# Patient Record
Sex: Female | Born: 1935 | Race: White | Hispanic: No | Smoking: Never smoker
Health system: Southern US, Community
[De-identification: ages and names within clinical notes are randomized; demographics above are authoritative.]

## PROBLEM LIST (undated history)

## (undated) DIAGNOSIS — F0390 Unspecified dementia without behavioral disturbance: Secondary | ICD-10-CM

## (undated) DIAGNOSIS — I4891 Unspecified atrial fibrillation: Secondary | ICD-10-CM

## (undated) HISTORY — PX: ABDOMINAL HYSTERECTOMY: SHX81

## (undated) HISTORY — PX: THYROIDECTOMY: SHX17

## (undated) HISTORY — PX: KNEE SURGERY: SHX244

---

## 2005-09-19 ENCOUNTER — Other Ambulatory Visit: Payer: Self-pay

## 2005-09-19 ENCOUNTER — Ambulatory Visit: Payer: Self-pay | Admitting: Gastroenterology

## 2005-12-04 ENCOUNTER — Ambulatory Visit: Payer: Self-pay | Admitting: Gastroenterology

## 2006-12-21 ENCOUNTER — Emergency Department: Payer: Self-pay | Admitting: Emergency Medicine

## 2006-12-31 ENCOUNTER — Ambulatory Visit: Payer: Self-pay

## 2007-07-30 ENCOUNTER — Emergency Department: Payer: Self-pay | Admitting: Emergency Medicine

## 2007-07-30 ENCOUNTER — Other Ambulatory Visit: Payer: Self-pay

## 2009-03-01 ENCOUNTER — Ambulatory Visit: Payer: Self-pay

## 2009-06-01 IMAGING — CT CT OF THE LEFT KNEE WITHOUT CONTRAST
2 series · 10 of 14 positions shown, 12 images · non-contrast
Comparison: none

REASON FOR EXAM: 3 d reconstruction  left knee pain  status post fall
COMMENTS:

[Series 3: axial · axial · 0.39mm/px · z∈[-436,-304]mm · 5 of 66 slices shown, 7 images]
[im 11/66  soft-tissue]
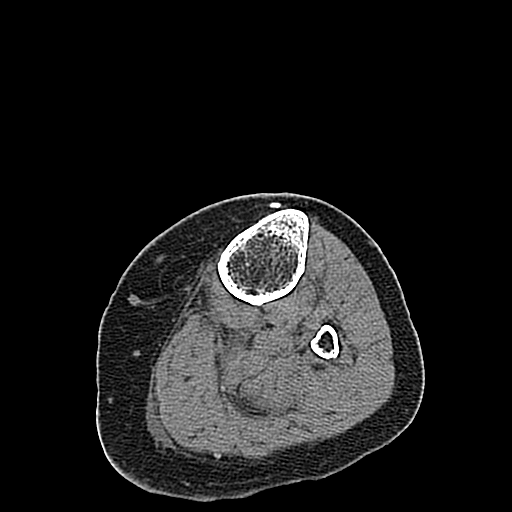
[im 11/66  bone]
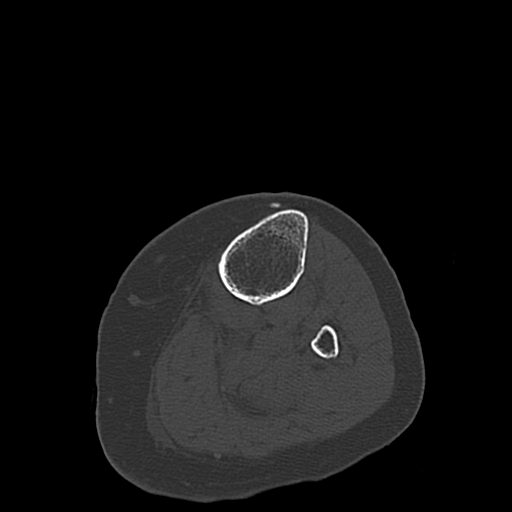
[im 22/66  bone]
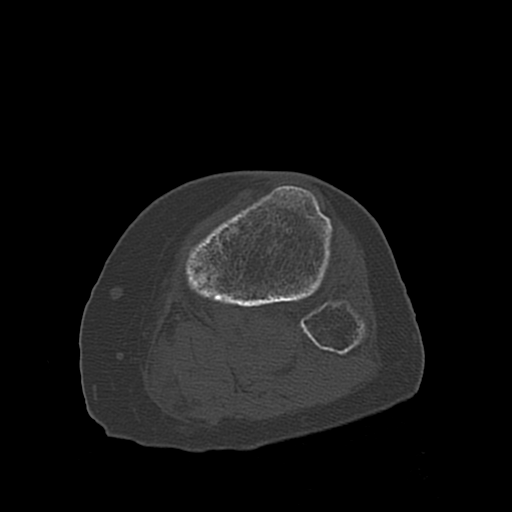
[im 33/66  bone]
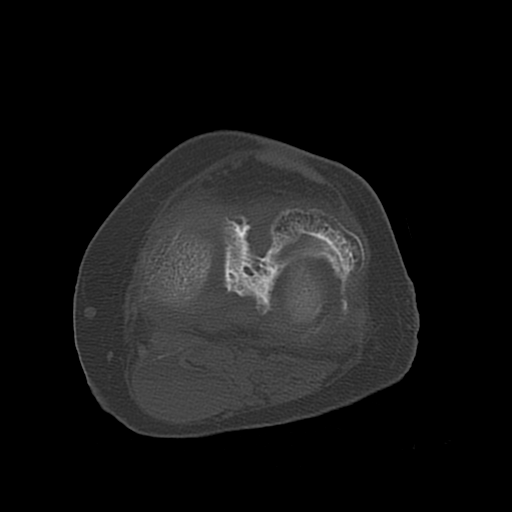
[im 44/66  bone]
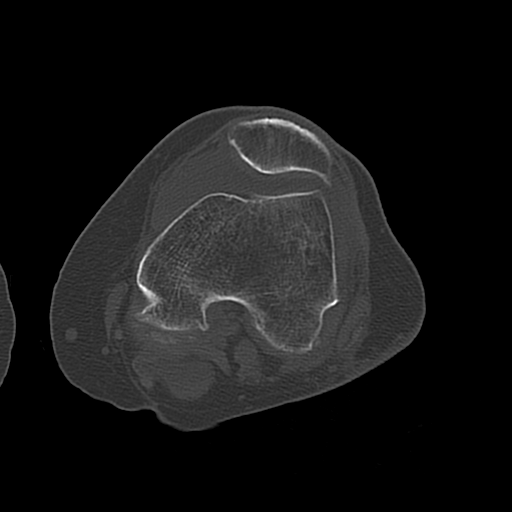
[im 55/66  soft-tissue]
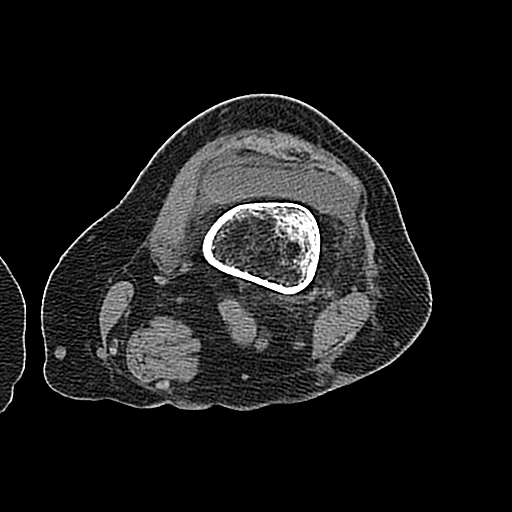
[im 55/66  bone]
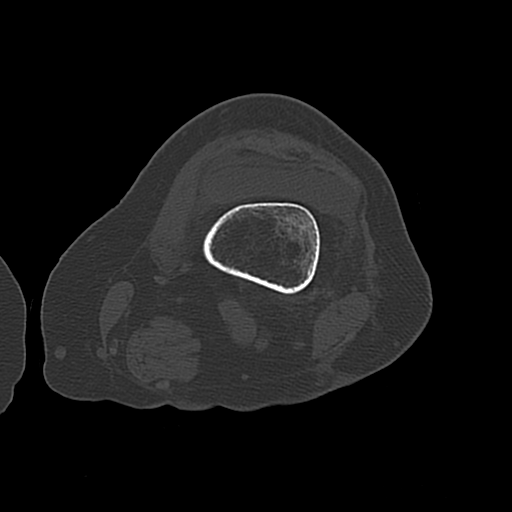

[Series 9: axial soft tissue · axial · 0.39mm/px · z∈[-436,-304]mm · 5 of 66 slices shown]
[im 11/66  soft-tissue]
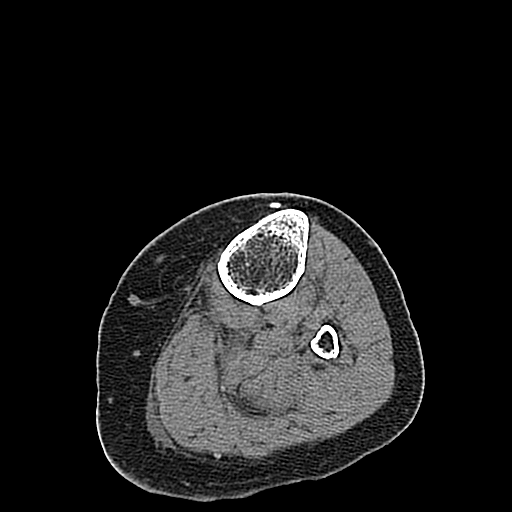
[im 22/66  soft-tissue]
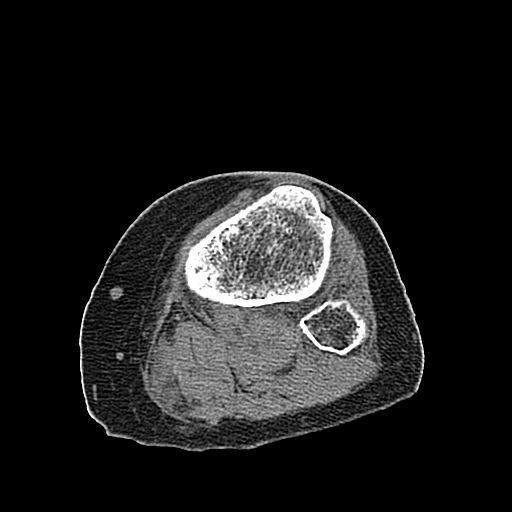
[im 33/66  soft-tissue]
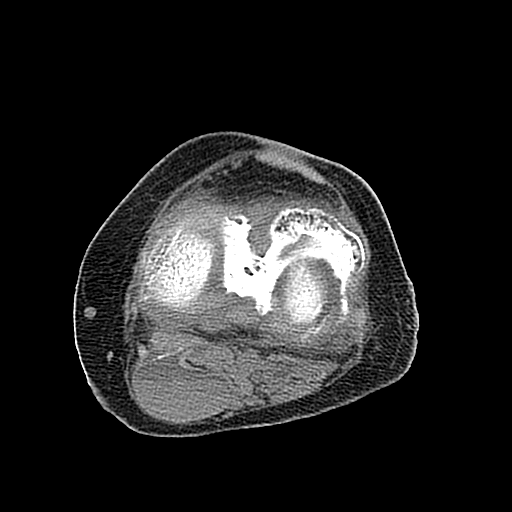
[im 44/66  soft-tissue]
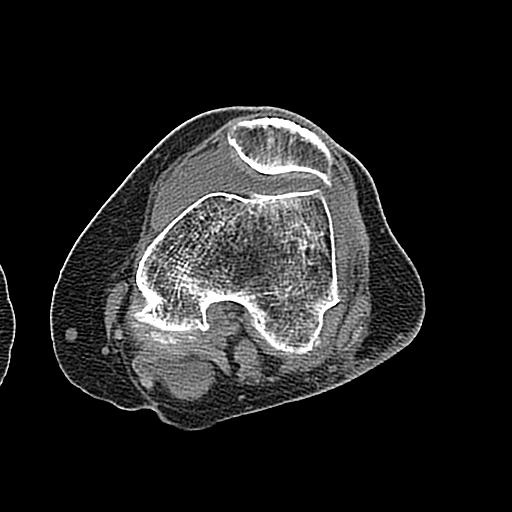
[im 55/66  soft-tissue]
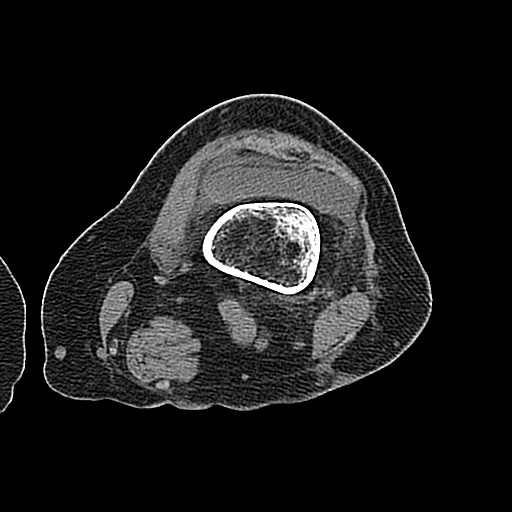

[10 of 14 positions shown; findings below may reference images not displayed]

PROCEDURE:     CT  - CT KNEE LEFT WO  - March 01, 2009 [DATE]

RESULT:     CT of the left knee is reconstructed at bone window settings in
the axial, coronal and sagittal planes. There is osteopenia with
degenerative change diffusely. There is thickening along the lateral tibial
plateau with irregularity of the cortex which could represent chronic
change. On the sagittal reconstructions there appears to be fracture along
the posterior aspect of the lateral tibial plateau with some small fragments
present. There does not appear to be significant depression of the lateral
tibial plateau but a vertical component is present on the sagittal
reconstructions on image #13. The distal femur appears intact. The patella
appears intact. Patellofemoral joint space narrowing is present. The
proximal fibula appears intact.
IMPRESSION: Degenerative changes and osteopenia are present. There does
appear to be proximal tibial fracture especially in the posterior lateral
tibial plateau region.

## 2010-12-23 ENCOUNTER — Emergency Department: Payer: Self-pay | Admitting: Unknown Physician Specialty

## 2011-03-25 IMAGING — CR DG CHEST 1V PORT
1 series · 1 of 1 positions shown · non-contrast
Comparison: none

REASON FOR EXAM: cough sob
COMMENTS:

PROCEDURE:     DXR - DXR PORTABLE CHEST SINGLE VIEW  - December 23, 2010 [DATE]
RESULT:     Comparison: None.

[view not recorded]
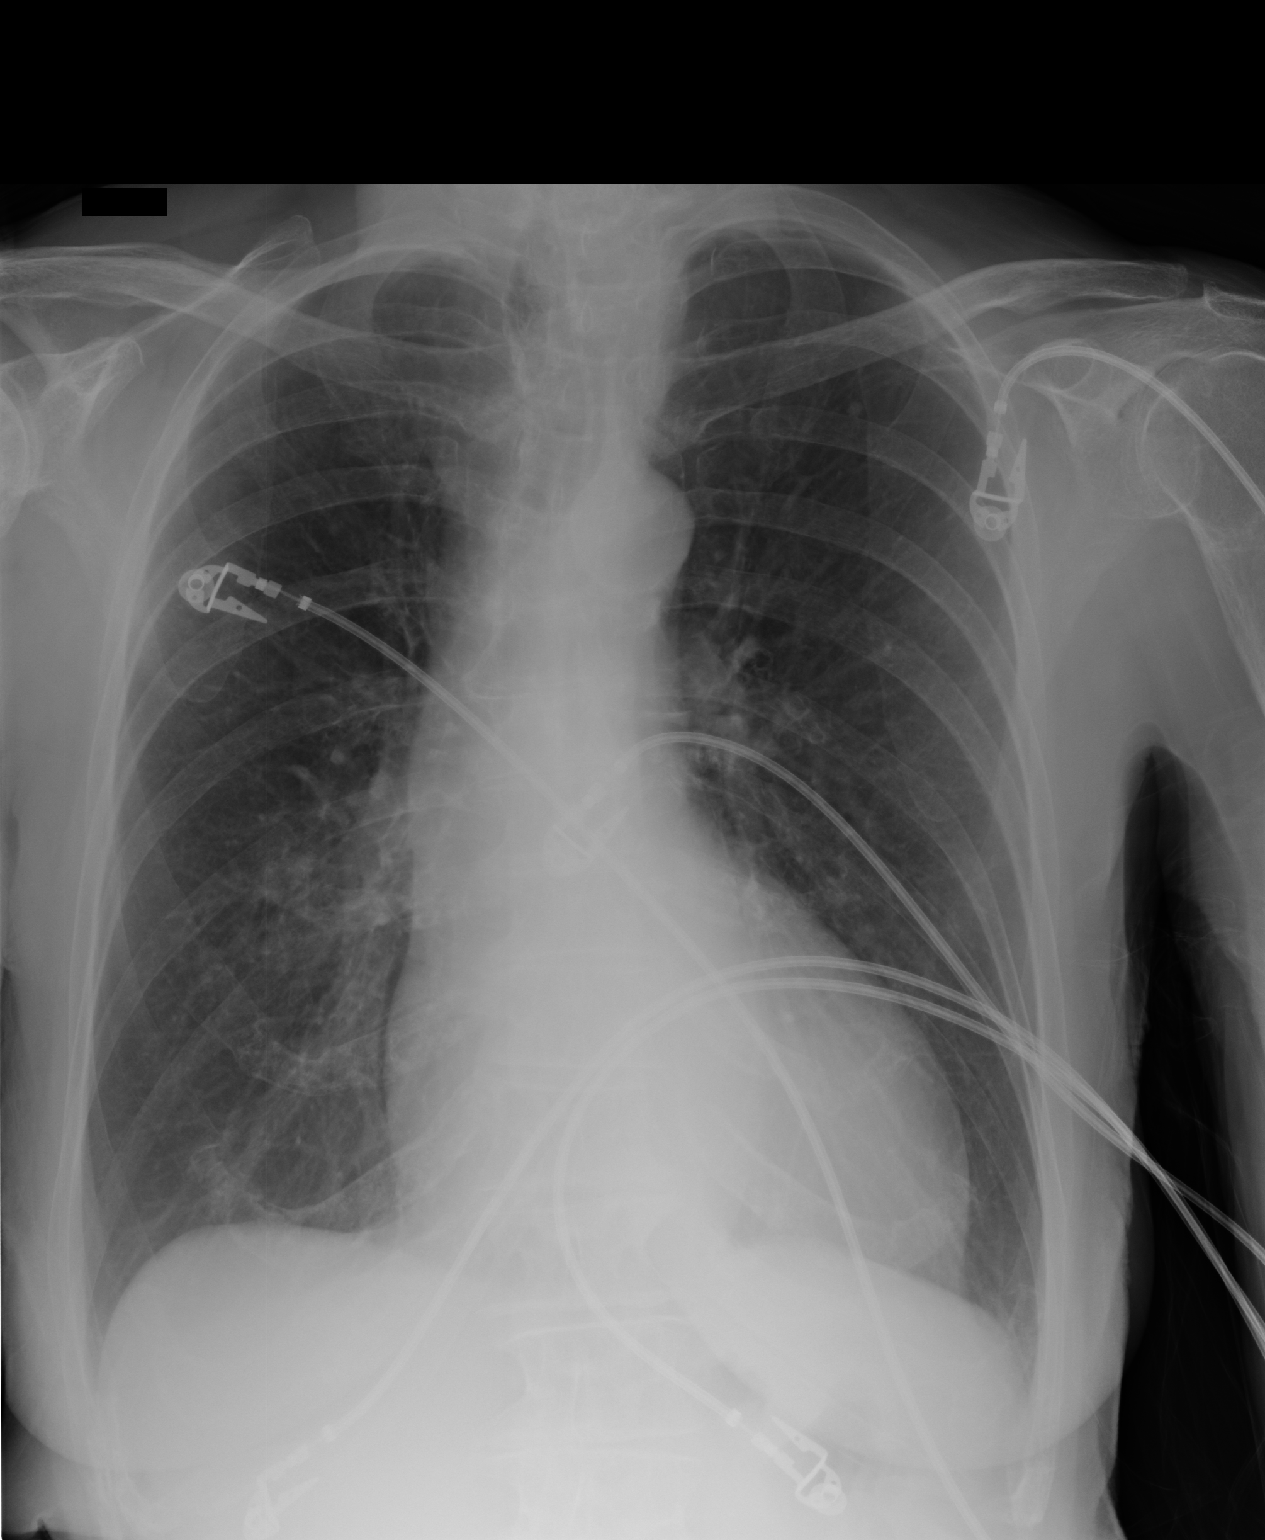

[1 of 1 positions shown; findings below may reference images not displayed]

FINDINGS: The heart is mildly enlarged. Minimal opacity overlying the right lower lung
is felt to be secondary to overlying costochondral junction. No focal
parenchymal opacities. The lungs are hyperinflated. Tiny nodular density in
the left upper lung is likely calcified given its density to small size.
IMPRESSION: 1. Mild hyperinflation. Otherwise, no acute cardio pulmonary disease.
2. Tiny nodular density in the left upper lung is likely calcified given its
density too small size. However, followup radiographs are recommended to
ensure stability.

## 2013-10-14 ENCOUNTER — Emergency Department: Payer: Self-pay | Admitting: Emergency Medicine

## 2013-10-14 LAB — CBC
HCT: 37.8 % (ref 35.0–47.0)
HGB: 12.7 g/dL (ref 12.0–16.0)
MCH: 32.3 pg (ref 26.0–34.0)
MCHC: 33.6 g/dL (ref 32.0–36.0)
MCV: 96 fL (ref 80–100)
RBC: 3.93 10*6/uL (ref 3.80–5.20)
RDW: 13.5 % (ref 11.5–14.5)

## 2013-10-14 LAB — BASIC METABOLIC PANEL
Calcium, Total: 9 mg/dL (ref 8.5–10.1)
Chloride: 104 mmol/L (ref 98–107)
Co2: 28 mmol/L (ref 21–32)
EGFR (African American): 56 — ABNORMAL LOW
EGFR (Non-African Amer.): 48 — ABNORMAL LOW
Potassium: 4.2 mmol/L (ref 3.5–5.1)
Sodium: 139 mmol/L (ref 136–145)

## 2013-10-14 LAB — PROTIME-INR: Prothrombin Time: 24.8 secs — ABNORMAL HIGH (ref 11.5–14.7)

## 2013-10-14 LAB — TROPONIN I: Troponin-I: <0.02 ng/mL

## 2014-01-14 IMAGING — CR DG CHEST 2V
1 series · 3 of 3 positions shown · non-contrast
Comparison: 12/23/2010

CLINICAL DATA: Sternal pain since [REDACTED]. Increased heart rate
sometimes.

EXAM:
CHEST  2 VIEW

[Series 1: w chest pa · 0.14mm/px · 3 of 3 slices shown]
[im 1/3]
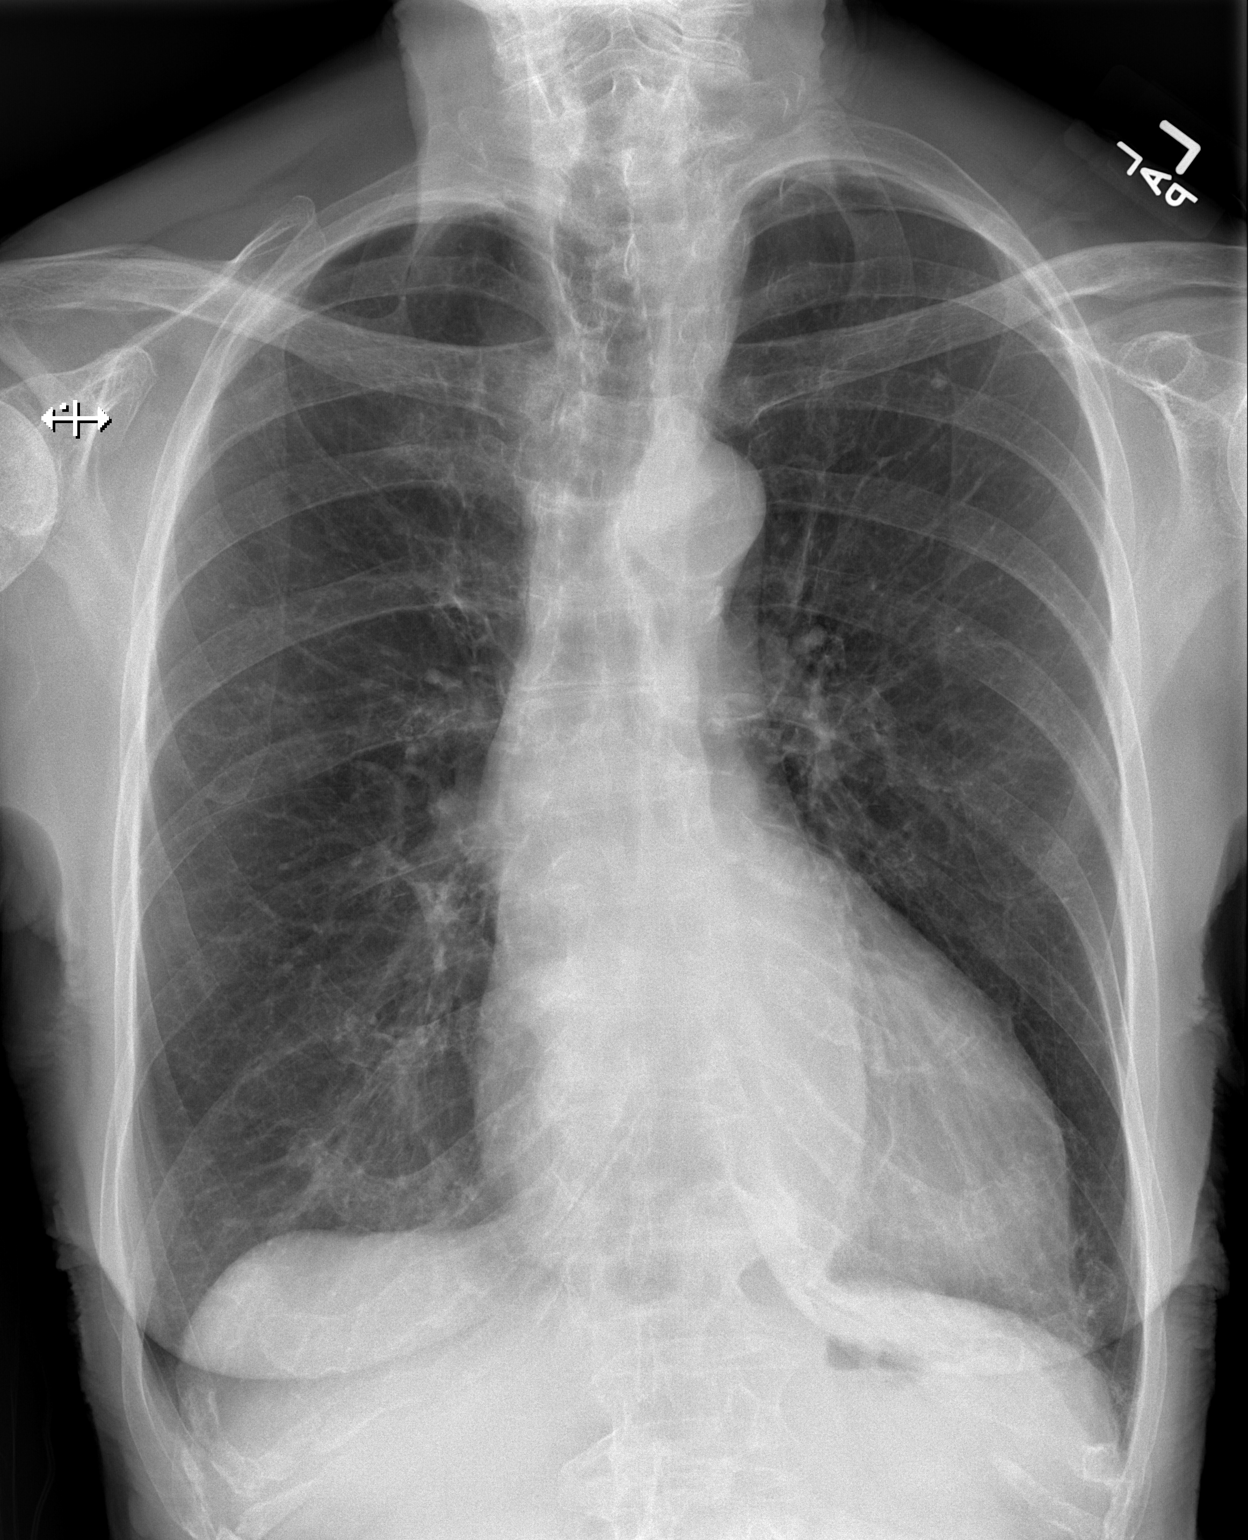
[im 2/3]
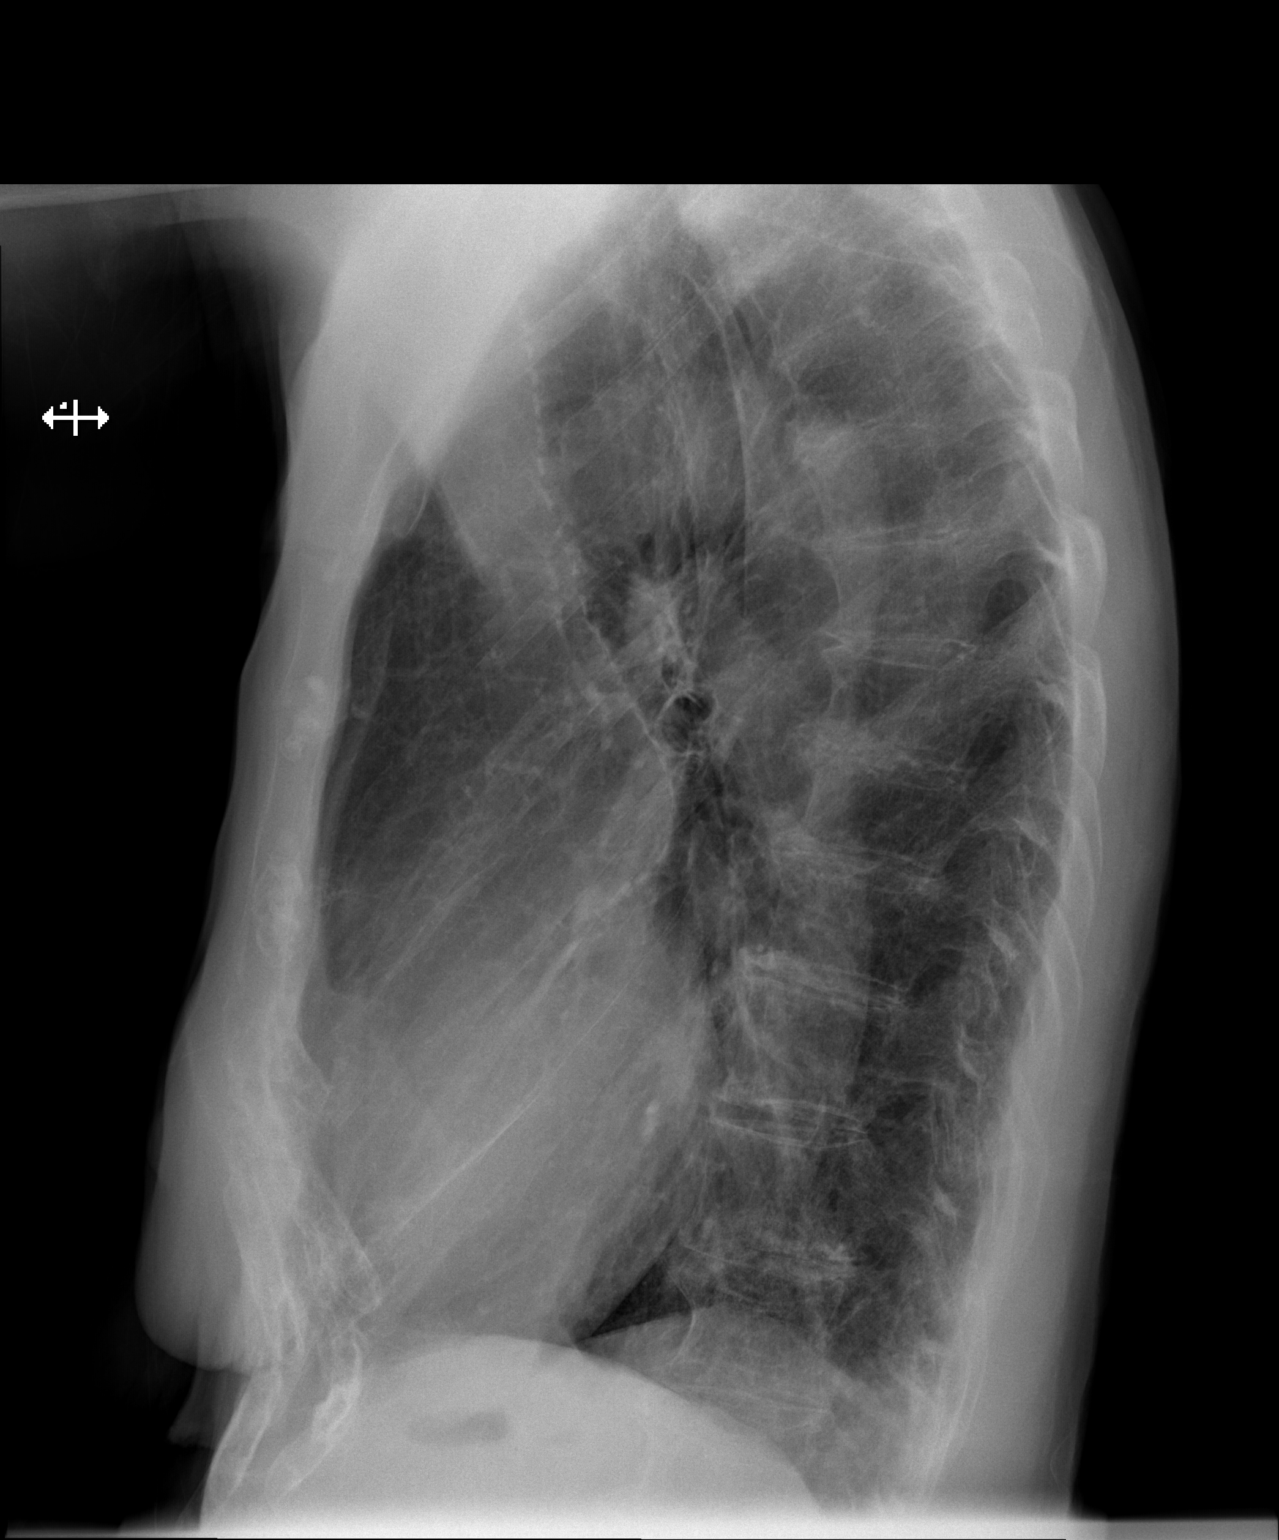
[im 3/3]
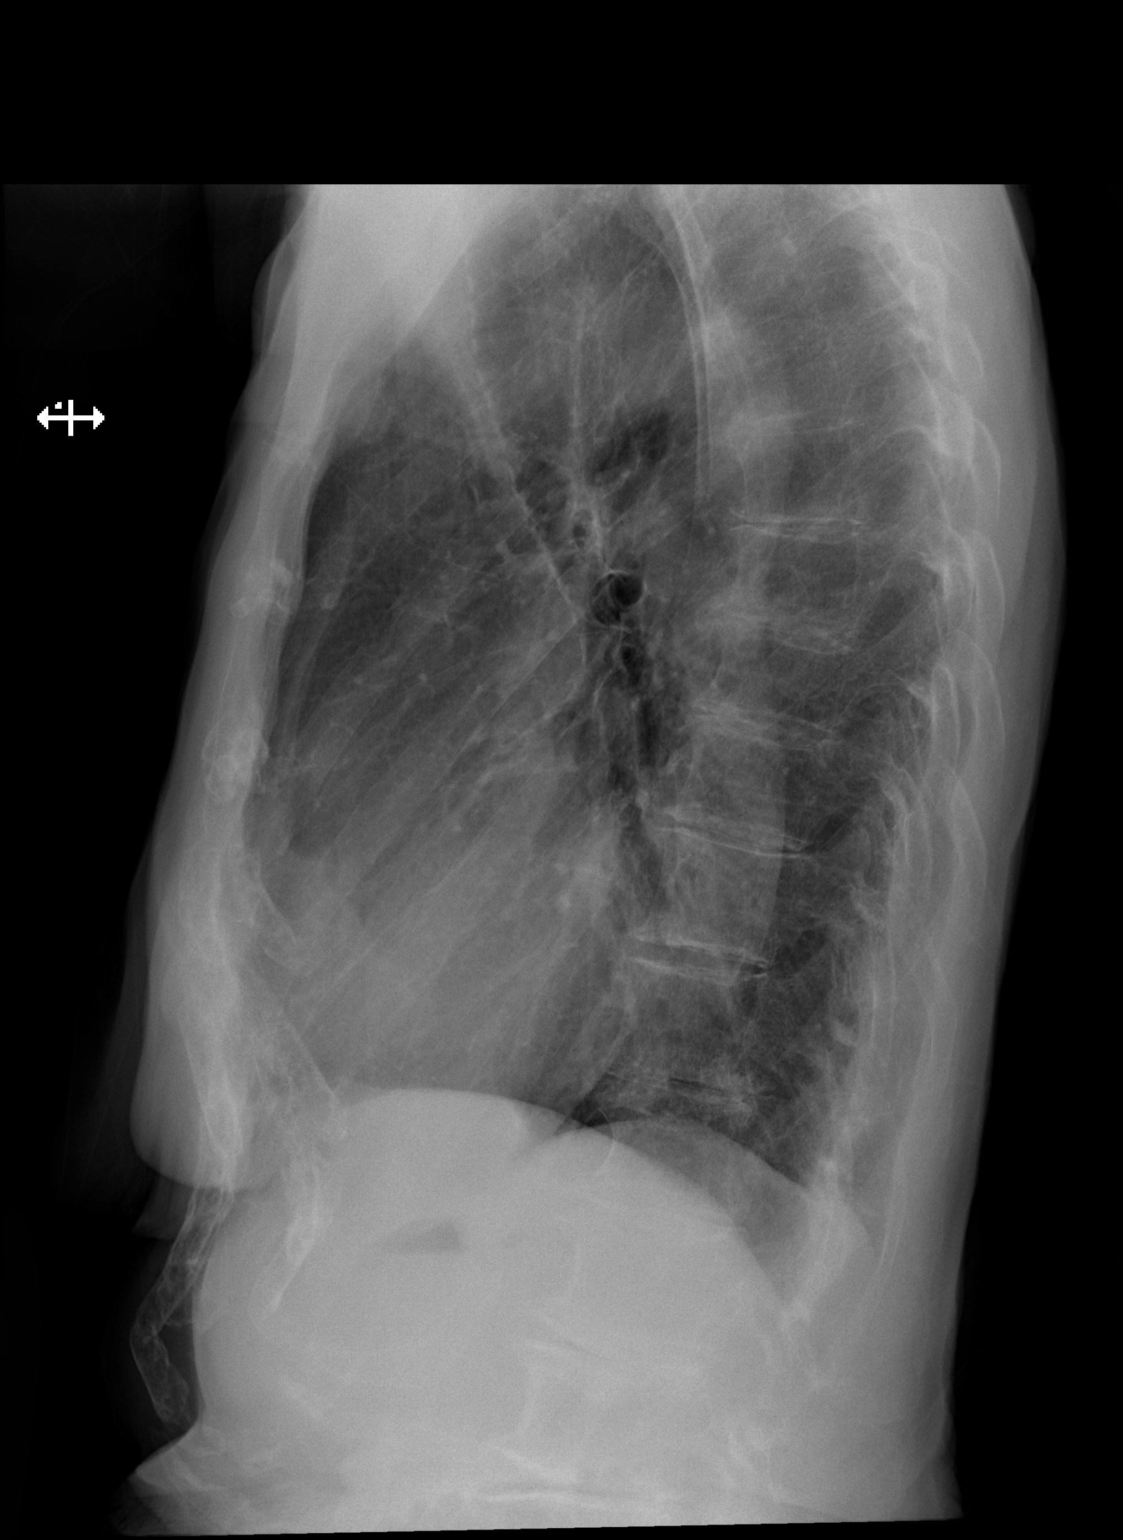

[3 of 3 positions shown; findings below may reference images not displayed]

FINDINGS: The lungs are hyperinflated likely secondary to COPD. There is a
mm left upper lobe pulmonary nodule unchanged compared with
12/23/2010. There is no focal parenchymal opacity, pleural effusion,
or pneumothorax. The heart and mediastinal contours are
unremarkable.

The osseous structures are unremarkable.
IMPRESSION: No active cardiopulmonary disease.

## 2014-04-27 ENCOUNTER — Emergency Department: Payer: Self-pay | Admitting: Emergency Medicine

## 2014-04-27 LAB — URINALYSIS, COMPLETE
BACTERIA: NONE SEEN
BILIRUBIN, UR: NEGATIVE
BLOOD: NEGATIVE
Glucose,UR: 50 mg/dL (ref 0–75)
Ketone: NEGATIVE
NITRITE: NEGATIVE
Ph: 5 (ref 4.5–8.0)
Protein: 100
RBC,UR: 12 /HPF (ref 0–5)
Specific Gravity: 1.021 (ref 1.003–1.030)

## 2014-04-27 LAB — CBC WITH DIFFERENTIAL/PLATELET
BASOS PCT: 0.6 %
Basophil #: 0 10*3/uL (ref 0.0–0.1)
EOS ABS: 0.1 10*3/uL (ref 0.0–0.7)
Eosinophil %: 0.8 %
HCT: 42.9 % (ref 35.0–47.0)
HGB: 14 g/dL (ref 12.0–16.0)
LYMPHS PCT: 13 %
Lymphocyte #: 0.8 10*3/uL — ABNORMAL LOW (ref 1.0–3.6)
MCH: 32 pg (ref 26.0–34.0)
MCHC: 32.7 g/dL (ref 32.0–36.0)
MCV: 98 fL (ref 80–100)
MONO ABS: 0.4 x10 3/mm (ref 0.2–0.9)
Monocyte %: 5.8 %
NEUTROS PCT: 79.8 %
Neutrophil #: 5.2 10*3/uL (ref 1.4–6.5)
Platelet: 217 10*3/uL (ref 150–440)
RBC: 4.38 10*6/uL (ref 3.80–5.20)
RDW: 13.7 % (ref 11.5–14.5)
WBC: 6.5 10*3/uL (ref 3.6–11.0)

## 2014-04-27 LAB — BASIC METABOLIC PANEL
Anion Gap: 9 (ref 7–16)
BUN: 24 mg/dL — ABNORMAL HIGH (ref 7–18)
CALCIUM: 8.8 mg/dL (ref 8.5–10.1)
CO2: 23 mmol/L (ref 21–32)
Chloride: 112 mmol/L — ABNORMAL HIGH (ref 98–107)
Creatinine: 1.33 mg/dL — ABNORMAL HIGH (ref 0.60–1.30)
EGFR (Non-African Amer.): 36 — ABNORMAL LOW
GFR CALC AF AMER: 42 — AB
Glucose: 80 mg/dL (ref 65–99)
Osmolality: 290 (ref 275–301)
POTASSIUM: 4.4 mmol/L (ref 3.5–5.1)
SODIUM: 144 mmol/L (ref 136–145)

## 2014-04-27 LAB — TSH: THYROID STIMULATING HORM: 5.52 u[IU]/mL — AB

## 2014-04-27 LAB — TROPONIN I: Troponin-I: <0.02 ng/mL

## 2014-04-28 LAB — URINE CULTURE

## 2015-10-28 ENCOUNTER — Inpatient Hospital Stay
Admission: EM | Admit: 2015-10-28 | Discharge: 2015-10-30 | DRG: 918 | Disposition: A | Discharge status: Home / Self Care | Payer: Medicare PPO | Attending: Internal Medicine | Admitting: Internal Medicine

## 2015-10-28 ENCOUNTER — Encounter: Payer: Self-pay | Admitting: Emergency Medicine

## 2015-10-28 DIAGNOSIS — Z90710 Acquired absence of both cervix and uterus: Secondary | ICD-10-CM

## 2015-10-28 DIAGNOSIS — I482 Chronic atrial fibrillation: Secondary | ICD-10-CM | POA: Diagnosis present

## 2015-10-28 DIAGNOSIS — Z7901 Long term (current) use of anticoagulants: Secondary | ICD-10-CM

## 2015-10-28 DIAGNOSIS — Z79899 Other long term (current) drug therapy: Secondary | ICD-10-CM

## 2015-10-28 DIAGNOSIS — I10 Essential (primary) hypertension: Secondary | ICD-10-CM | POA: Diagnosis present

## 2015-10-28 DIAGNOSIS — K920 Hematemesis: Secondary | ICD-10-CM | POA: Diagnosis present

## 2015-10-28 DIAGNOSIS — E039 Hypothyroidism, unspecified: Secondary | ICD-10-CM | POA: Diagnosis present

## 2015-10-28 DIAGNOSIS — I350 Nonrheumatic aortic (valve) stenosis: Secondary | ICD-10-CM | POA: Diagnosis present

## 2015-10-28 DIAGNOSIS — E890 Postprocedural hypothyroidism: Secondary | ICD-10-CM | POA: Diagnosis present

## 2015-10-28 DIAGNOSIS — F0390 Unspecified dementia without behavioral disturbance: Secondary | ICD-10-CM | POA: Diagnosis present

## 2015-10-28 DIAGNOSIS — Z9889 Other specified postprocedural states: Secondary | ICD-10-CM

## 2015-10-28 DIAGNOSIS — T45511A Poisoning by anticoagulants, accidental (unintentional), initial encounter: Secondary | ICD-10-CM | POA: Diagnosis not present

## 2015-10-28 HISTORY — DX: Unspecified atrial fibrillation: I48.91

## 2015-10-28 HISTORY — DX: Unspecified dementia, unspecified severity, without behavioral disturbance, psychotic disturbance, mood disturbance, and anxiety: F03.90

## 2015-10-28 NOTE — ED Notes (Signed)
EMS states they were called out for chest pain but on arrival pt states she just feels bad. Pt started vomiting while EMS was at home. Pt was given 4 mg Zofran IV PTA with no relief. Upon arrival to ER pt denies pain and states she just hasn't felt good today. Pt vomited small amount x1 during triage.

## 2015-10-29 ENCOUNTER — Encounter: Payer: Self-pay | Admitting: Internal Medicine

## 2015-10-29 DIAGNOSIS — I10 Essential (primary) hypertension: Secondary | ICD-10-CM | POA: Diagnosis present

## 2015-10-29 DIAGNOSIS — Z7901 Long term (current) use of anticoagulants: Secondary | ICD-10-CM | POA: Diagnosis not present

## 2015-10-29 DIAGNOSIS — F0390 Unspecified dementia without behavioral disturbance: Secondary | ICD-10-CM | POA: Diagnosis present

## 2015-10-29 DIAGNOSIS — I350 Nonrheumatic aortic (valve) stenosis: Secondary | ICD-10-CM | POA: Diagnosis present

## 2015-10-29 DIAGNOSIS — K920 Hematemesis: Secondary | ICD-10-CM | POA: Diagnosis present

## 2015-10-29 DIAGNOSIS — T45511A Poisoning by anticoagulants, accidental (unintentional), initial encounter: Secondary | ICD-10-CM | POA: Diagnosis present

## 2015-10-29 DIAGNOSIS — E039 Hypothyroidism, unspecified: Secondary | ICD-10-CM | POA: Diagnosis present

## 2015-10-29 DIAGNOSIS — E890 Postprocedural hypothyroidism: Secondary | ICD-10-CM | POA: Diagnosis present

## 2015-10-29 DIAGNOSIS — I482 Chronic atrial fibrillation: Secondary | ICD-10-CM | POA: Diagnosis present

## 2015-10-29 DIAGNOSIS — Z79899 Other long term (current) drug therapy: Secondary | ICD-10-CM | POA: Diagnosis not present

## 2015-10-29 DIAGNOSIS — Z9889 Other specified postprocedural states: Secondary | ICD-10-CM | POA: Diagnosis not present

## 2015-10-29 DIAGNOSIS — Z90710 Acquired absence of both cervix and uterus: Secondary | ICD-10-CM | POA: Diagnosis not present

## 2015-10-29 LAB — CBC WITH DIFFERENTIAL/PLATELET
Basophils Absolute: 0 10*3/uL (ref 0–0.1)
Basophils Relative: 0 %
EOS ABS: 0 10*3/uL (ref 0–0.7)
EOS PCT: 0 %
HCT: 37.7 % (ref 35.0–47.0)
Hemoglobin: 12.2 g/dL (ref 12.0–16.0)
LYMPHS ABS: 0.5 10*3/uL — AB (ref 1.0–3.6)
Lymphocytes Relative: 6 %
MCH: 31.5 pg (ref 26.0–34.0)
MCHC: 32.5 g/dL (ref 32.0–36.0)
MCV: 96.8 fL (ref 80.0–100.0)
MONO ABS: 0.3 10*3/uL (ref 0.2–0.9)
Monocytes Relative: 4 %
Neutro Abs: 7.6 10*3/uL — ABNORMAL HIGH (ref 1.4–6.5)
Neutrophils Relative %: 90 %
PLATELETS: 195 10*3/uL (ref 150–440)
RBC: 3.89 MIL/uL (ref 3.80–5.20)
RDW: 12.8 % (ref 11.5–14.5)
WBC: 8.4 10*3/uL (ref 3.6–11.0)

## 2015-10-29 LAB — COMPREHENSIVE METABOLIC PANEL
ALBUMIN: 3.9 g/dL (ref 3.5–5.0)
ALT: 20 U/L (ref 14–54)
ANION GAP: 7 (ref 5–15)
AST: 26 U/L (ref 15–41)
Alkaline Phosphatase: 73 U/L (ref 38–126)
BUN: 29 mg/dL — AB (ref 6–20)
CHLORIDE: 105 mmol/L (ref 101–111)
CO2: 27 mmol/L (ref 22–32)
Calcium: 8.8 mg/dL — ABNORMAL LOW (ref 8.9–10.3)
Creatinine, Ser: 1.14 mg/dL — ABNORMAL HIGH (ref 0.44–1.00)
GFR calc Af Amer: 48 mL/min — ABNORMAL LOW (ref >60)
GFR calc non Af Amer: 42 mL/min — ABNORMAL LOW (ref >60)
GLUCOSE: 148 mg/dL — AB (ref 65–99)
POTASSIUM: 4.2 mmol/L (ref 3.5–5.1)
SODIUM: 139 mmol/L (ref 135–145)
Total Bilirubin: 1 mg/dL (ref 0.3–1.2)
Total Protein: 6.5 g/dL (ref 6.5–8.1)

## 2015-10-29 LAB — URINALYSIS COMPLETE WITH MICROSCOPIC (ARMC ONLY)
Bacteria, UA: NONE SEEN
Bilirubin Urine: NEGATIVE
Glucose, UA: NEGATIVE mg/dL
Hgb urine dipstick: NEGATIVE
Nitrite: NEGATIVE
Protein, ur: NEGATIVE mg/dL
Specific Gravity, Urine: 1.013 (ref 1.005–1.030)
pH: 6 (ref 5.0–8.0)

## 2015-10-29 LAB — PROTIME-INR
INR: 1.45
Prothrombin Time: 17.7 seconds — ABNORMAL HIGH (ref 11.4–15.0)

## 2015-10-29 LAB — HEMOGLOBIN A1C: Hgb A1c MFr Bld: 5.5 % (ref 4.0–6.0)

## 2015-10-29 LAB — HEMOGLOBIN AND HEMATOCRIT, BLOOD
HCT: 36.5 % (ref 35.0–47.0)
Hemoglobin: 12.1 g/dL (ref 12.0–16.0)

## 2015-10-29 LAB — TYPE AND SCREEN
ABO/RH(D): O POS
Antibody Screen: NEGATIVE

## 2015-10-29 LAB — APTT: APTT: 30 s (ref 24–36)

## 2015-10-29 LAB — TSH: TSH: 6.948 u[IU]/mL — ABNORMAL HIGH (ref 0.350–4.500)

## 2015-10-29 LAB — LIPASE, BLOOD: LIPASE: 62 U/L — AB (ref 11–51)

## 2015-10-29 LAB — ABO/RH: ABO/RH(D): O POS

## 2015-10-29 LAB — LACTIC ACID, PLASMA
Lactic Acid, Venous: 1.4 mmol/L (ref 0.5–2.0)
Lactic Acid, Venous: 2.2 mmol/L (ref 0.5–2.0)

## 2015-10-29 LAB — TROPONIN I: Troponin I: <0.03 ng/mL (ref <0.031)

## 2015-10-29 MED ORDER — FENTANYL CITRATE (PF) 100 MCG/2ML IJ SOLN: 12.5000 ug | INTRAMUSCULAR | Status: DC | PRN

## 2015-10-29 MED ORDER — SODIUM CHLORIDE 0.9 % IV SOLN
Freq: Once | INTRAVENOUS | Status: AC
  Administered 2015-10-29: 03:00:00 via INTRAVENOUS

## 2015-10-29 MED ORDER — SODIUM CHLORIDE 0.9 % IJ SOLN
3.0000 mL | Freq: Two times a day (BID) | INTRAMUSCULAR | Status: DC
  Administered 2015-10-29 – 2015-10-30 (×2): 3 mL via INTRAVENOUS

## 2015-10-29 MED ORDER — ONDANSETRON HCL 4 MG/2ML IJ SOLN: 4.0000 mg | Freq: Four times a day (QID) | INTRAMUSCULAR | Status: DC | PRN

## 2015-10-29 MED ORDER — SODIUM CHLORIDE 0.9 % IV BOLUS (SEPSIS)
500.0000 mL | INTRAVENOUS | Status: AC
  Administered 2015-10-29: 500 mL via INTRAVENOUS

## 2015-10-29 MED ORDER — PANTOPRAZOLE SODIUM 40 MG IV SOLR
40.0000 mg | Freq: Two times a day (BID) | INTRAVENOUS | Status: DC
  Administered 2015-10-29 – 2015-10-30 (×3): 40 mg via INTRAVENOUS
  Filled 2015-10-29 (×3): qty 40

## 2015-10-29 MED ORDER — SODIUM CHLORIDE 0.9 % IV SOLN
INTRAVENOUS | Status: DC
  Administered 2015-10-29 – 2015-10-30 (×4): via INTRAVENOUS

## 2015-10-29 MED ORDER — LEVOTHYROXINE SODIUM 50 MCG PO TABS
50.0000 ug | ORAL_TABLET | Freq: Every day | ORAL | Status: DC
  Administered 2015-10-30: 50 ug via ORAL
  Filled 2015-10-29: qty 1

## 2015-10-29 MED ORDER — DONEPEZIL HCL 5 MG PO TABS
10.0000 mg | ORAL_TABLET | Freq: Every day | ORAL | Status: DC
  Administered 2015-10-29: 10 mg via ORAL
  Filled 2015-10-29: qty 2

## 2015-10-29 MED ORDER — PROTHROMBIN COMPLEX CONC HUMAN 500 UNITS IV KIT
1500.0000 [IU] | PACK | INTRAVENOUS | Status: AC
  Administered 2015-10-29: 1500 [IU] via INTRAVENOUS
  Filled 2015-10-29: qty 60

## 2015-10-29 MED ORDER — ONDANSETRON HCL 4 MG/2ML IJ SOLN
4.0000 mg | Freq: Once | INTRAMUSCULAR | Status: AC
  Administered 2015-10-29: 4 mg via INTRAVENOUS
  Filled 2015-10-29: qty 2

## 2015-10-29 MED ORDER — LATANOPROST 0.005 % OP SOLN
1.0000 [drp] | Freq: Every day | OPHTHALMIC | Status: DC
  Filled 2015-10-29 (×2): qty 2.5

## 2015-10-29 MED ORDER — ACETAMINOPHEN 325 MG PO TABS
650.0000 mg | ORAL_TABLET | Freq: Four times a day (QID) | ORAL | Status: DC | PRN
  Filled 2015-10-29: qty 2

## 2015-10-29 MED ORDER — ACETAMINOPHEN 650 MG RE SUPP
650.0000 mg | Freq: Four times a day (QID) | RECTAL | Status: DC | PRN
Start: 2015-10-29 — End: 2015-10-30

## 2015-10-29 MED ORDER — ATENOLOL 25 MG PO TABS
12.5000 mg | ORAL_TABLET | Freq: Every day | ORAL | Status: DC
  Administered 2015-10-30: 12.5 mg via ORAL
  Filled 2015-10-29: qty 1

## 2015-10-29 MED ORDER — ONDANSETRON HCL 4 MG PO TABS: 4.0000 mg | ORAL_TABLET | Freq: Four times a day (QID) | ORAL | Status: DC | PRN

## 2015-10-29 NOTE — Progress Notes (Signed)
[facility] monitoring:  81 yo F on Apixaban at home with apparent overdose, hematemesis.  Kcentra ordered and 1500 units given 12/18 at 0310. Consider checking INR, CBC ordered for am.  [employer] and there are no recommendations for monitoring labs for use of Kcentra in Apixaban "reversal" as it is an off-label use and does not actually reverse the drug itself.  Bari MantisKristin Babita Amaker PharmD Clinical Pharmacist 10/29/2015 5:11 PM

## 2015-10-29 NOTE — Progress Notes (Signed)
No vomiting this shift. GI consulted. Patient remains confused. Family at bedside. Bo McclintockBrewer,Isaac Dubie S, RN

## 2015-10-29 NOTE — H&P (Addendum)
Shelly Zamora is an 81 y.o. female.   Chief Complaint: Vomiting HPI: The patient presents emergency department after vomiting and appearing ill and generally weak visited by family members. In the emergency department she had an episode of gross hematemesis of coffee ground appearance. She was also found to be somewhat hypothermic with a rectal temperature of 95.63F. Notably the patient has no recollection of feeling bad or of vomiting blood. She also denies diarrhea or blood in her stool. Past history significant for dementia and the patient had multiple Aricept pills missing from her medicine box, as well as 3 tablets of Eliquis. In the emergency department she was given K-Centra to reverse anticoagulant overdose. Due to apparent GI bleed the emergency department staff called for admission.  Past Medical History  Diagnosis Date  . Dementia   . Atrial fibrillation [facility])     Past Surgical History  Procedure Laterality Date  . Thyroidectomy    . Abdominal hysterectomy    . Knee surgery      Family History  Problem Relation Age of Onset  .      Social History:  reports that she has never smoked. She does not have any smokeless tobacco history on file. Her alcohol and drug histories are not on file.  Allergies: No Known Allergies  Medications Prior to Admission  Medication Sig Dispense Refill  . apixaban (ELIQUIS) 2.5 MG TABS tablet Take 1 tablet by mouth 2 (two) times daily.    Marland Kitchen atenolol (TENORMIN) 25 MG tablet Take 0.5 tablets by mouth daily.    Marland Kitchen donepezil (ARICEPT) 10 MG tablet Take 1 tablet by mouth at bedtime.    Marland Kitchen levothyroxine (SYNTHROID, LEVOTHROID) 50 MCG tablet Take 1 tablet by mouth daily.    . Travoprost, BAK Free, (TRAVATAN Z) 0.004 % SOLN ophthalmic solution Apply 1 drop to eye at bedtime.      Results for orders placed or performed during the hospital encounter of 10/28/15 (from the past 48 hour(s))  CBC with Differential/Platelet     Status: Abnormal   Collection  Time: 10/29/15  1:05 AM  Result Value Ref Range   WBC 8.4 3.6 - 11.0 K/uL   RBC 3.89 3.80 - 5.20 MIL/uL   Hemoglobin 12.2 12.0 - 16.0 g/dL   HCT 16.5 57.1 - 32.2 %   MCV 96.8 80.0 - 100.0 fL   MCH 31.5 26.0 - 34.0 pg   MCHC 32.5 32.0 - 36.0 g/dL   RDW 73.8 92.6 - 01.8 %   Platelets 195 150 - 440 K/uL   Neutrophils Relative % 90 %   Neutro Abs 7.6 (H) 1.4 - 6.5 K/uL   Lymphocytes Relative 6 %   Lymphs Abs 0.5 (L) 1.0 - 3.6 K/uL   Monocytes Relative 4 %   Monocytes Absolute 0.3 0.2 - 0.9 K/uL   Eosinophils Relative 0 %   Eosinophils Absolute 0.0 0 - 0.7 K/uL   Basophils Relative 0 %   Basophils Absolute 0.0 0 - 0.1 K/uL  Lipase, blood     Status: Abnormal   Collection Time: 10/29/15  1:05 AM  Result Value Ref Range   Lipase 62 (H) 11 - 51 U/L  Comprehensive metabolic panel     Status: Abnormal   Collection Time: 10/29/15  1:05 AM  Result Value Ref Range   Sodium 139 135 - 145 mmol/L   Potassium 4.2 3.5 - 5.1 mmol/L   Chloride 105 101 - 111 mmol/L   CO2 27 22 - 32  mmol/L   Glucose, Bld 148 (H) 65 - 99 mg/dL   BUN 29 (H) 6 - 20 mg/dL   Creatinine, Ser 8.83 (H) 0.44 - 1.00 mg/dL   Calcium 8.8 (L) 8.9 - 10.3 mg/dL   Total Protein 6.5 6.5 - 8.1 g/dL   Albumin 3.9 3.5 - 5.0 g/dL   AST 26 15 - 41 U/L   ALT 20 14 - 54 U/L   Alkaline Phosphatase 73 38 - 126 U/L   Total Bilirubin 1.0 0.3 - 1.2 mg/dL   GFR calc non Af Amer 42 (L) >60 mL/min   GFR calc Af Amer 48 (L) >60 mL/min    Comment: (NOTE) The eGFR has been calculated using the CKD EPI equation. This calculation has not been validated in all clinical situations. eGFR's persistently <60 mL/min signify possible Chronic Kidney Disease.    Anion gap 7 5 - 15  Troponin I     Status: None   Collection Time: 10/29/15  1:05 AM  Result Value Ref Range   Troponin I <0.03 <0.031 ng/mL    Comment:        NO INDICATION OF MYOCARDIAL INJURY.   Protime-INR     Status: Abnormal   Collection Time: 10/29/15  1:05 AM  Result Value  Ref Range   Prothrombin Time 17.7 (H) 11.4 - 15.0 seconds   INR 1.45   APTT     Status: None   Collection Time: 10/29/15  1:05 AM  Result Value Ref Range   aPTT 30 24 - 36 seconds  Lactic acid, plasma     Status: None   Collection Time: 10/29/15  1:47 AM  Result Value Ref Range   Lactic Acid, Venous 1.4 0.5 - 2.0 mmol/L  Type and screen [facility]     Status: None   Collection Time: 10/29/15  1:50 AM  Result Value Ref Range   ABO/RH(D) O POS    Antibody Screen NEG    Sample Expiration 11/01/2015   ABO/Rh     Status: None   Collection Time: 10/29/15  1:50 AM  Result Value Ref Range   ABO/RH(D) O POS    No results found.  Review of Systems  Constitutional: Negative for fever and chills.  HENT: Negative for sore throat and tinnitus.   Eyes: Negative for blurred vision and redness.  Respiratory: Negative for cough and shortness of breath.   Cardiovascular: Negative for chest pain, palpitations, orthopnea and PND.  Gastrointestinal: Negative for nausea, vomiting, abdominal pain and diarrhea.  Genitourinary: Negative for dysuria, urgency and frequency.  Musculoskeletal: Negative for myalgias and joint pain.  Skin: Negative for rash.       No lesions  Neurological: Negative for speech change, focal weakness and weakness.  Endo/Heme/Allergies: Does not bruise/bleed easily.       No temperature intolerance  Psychiatric/Behavioral: Negative for depression and suicidal ideas.    Blood pressure 144/82, pulse 78, temperature 97.4 F (36.3 C), temperature source Oral, resp. rate 16, height 5\' 7"  (1.702 m), weight 59.421 kg (131 lb), SpO2 100 %. Physical Exam  Vitals reviewed. Constitutional: She is oriented to person, place, and time. She appears well-developed and well-nourished.  HENT:  Head: Normocephalic and atraumatic.  Mouth/Throat: Oropharynx is clear and moist.  Eyes: Conjunctivae and EOM are normal. Pupils are equal, round, and reactive to light. No  scleral icterus.  Neck: Normal range of motion. Neck supple. No JVD present. No tracheal deviation present. No thyromegaly present.  Cardiovascular:  Normal rate and normal heart sounds.  An irregularly irregular rhythm present. Exam reveals no gallop and no friction rub.   No murmur heard. Respiratory: Effort normal and breath sounds normal.  GI: Soft. Bowel sounds are normal. She exhibits no distension. There is no tenderness.  Genitourinary:  Deferred  Lymphadenopathy:    She has no cervical adenopathy.  Neurological: She is alert and oriented to person, place, and time. No cranial nerve deficit. She exhibits normal muscle tone.  Skin: Skin is warm and dry. No rash noted. No erythema.  Psychiatric: She has a normal mood and affect. Her speech is normal and behavior is normal. Judgment and thought content normal. She exhibits normal recent memory.     Assessment/Plan This is an 81 year old Caucasian female admitted for hematemesis. 1. Hematemesis: Heart rate and blood pressure stable. Recheck hemoglobin and hematocrit in 4 hours. Gastroenterology consulted for EGD in the morning. Protonix IV every 12 hours. 2. Atrial fibrillation: Benefit of reversing anticoagulation outweighs the risk of clot embolization in short-term.  3. Essential hypertension: Continue atenolol and the patient is able to take oral medications again 4. Hypothyroidism: Continue Synthroid 5. Dementia: Continue Aricept 5 6. DVT prophylaxis: SCDs 7. GI prophylaxis as above The patient is a full code. Time spent on admission orders and patient care approximately 45 minutes  Harrie Foreman 10/29/2015, 5:10 AM

## 2015-10-29 NOTE — ED Notes (Signed)
Dr. York CeriseForbach accompanied by this nurse for rectal exam

## 2015-10-29 NOTE — ED Provider Notes (Signed)
[facility] Emergency Department Provider Note  ____________________________________________  Time seen: Approximately 1:34 AM  I have reviewed the triage vital signs and the nursing notes.   HISTORY  Chief Complaint Weakness and Emesis  Patient has mild dementia, history primarily provided by son and daughter-in-law  HPI Shelly Zamora is a 81 y.o. female with mild dementia and chronic atrial fibrillation on Eliquis.  She presents by EMS after she began feeling ill at home and called her son for help.  She was vomiting and complaining of various nonspecific symptoms including general malaise, headache which is since resolved, and looking generally ill.  The paramedic who brought her in told me that he is concerned that she is vomiting blood based on the look of the emesis.  The patient was noted to have a rectal temperature of 95.3 upon arrival and is ill-appearing although not actively vomiting.  The patient's son commented that earlier this morning his brother checked on the patient and she had the appropriate medications and her daily meds dosing container.  When the patient's son who is in the emergency department checked on her this evening, 4 of her Eliquis were missing and 4 of her Aricept were missing, meaning that she took 3 extra doses of each at some point since 3 PM.  He has been in her normal state of health recently and has had no recent illness and no complaints of abdominal pain or other episodes of vomiting or diarrhea.  Currently she denies any abdominal pain, shortness of breath, chest pain, but during my exam she began vomiting again.  I looked at the emesis bag that is in her room and it has a moderate volume of coffee ground emesis in the bag.   Past Medical History  Diagnosis Date  . Dementia   . Atrial fibrillation (HCC)     There are no active problems to display for this patient.   Past Surgical History  Procedure Laterality Date   . Thyroidectomy      Current Outpatient Rx  Name  Route  Sig  Dispense  Refill  . apixaban (ELIQUIS) 2.5 MG TABS tablet   Oral   Take 1 tablet by mouth 2 (two) times daily.         Marland Kitchen. atenolol (TENORMIN) 25 MG tablet   Oral   Take 0.5 tablets by mouth daily.         Marland Kitchen. donepezil (ARICEPT) 10 MG tablet   Oral   Take 1 tablet by mouth at bedtime.         Marland Kitchen. levothyroxine (SYNTHROID, LEVOTHROID) 50 MCG tablet   Oral   Take 1 tablet by mouth daily.         . Travoprost, BAK Free, (TRAVATAN Z) 0.004 % SOLN ophthalmic solution   Ophthalmic   Apply 1 drop to eye at bedtime.           Allergies Review of patient's allergies indicates no known allergies.  No family history on file.  Social History Social History  Substance Use Topics  . Smoking status: Never Smoker   . Smokeless tobacco: None  . Alcohol Use: None    Review of Systems Constitutional: No fever/chills Eyes: No visual changes. ENT: No sore throat. Cardiovascular: Denies chest pain. Respiratory: Denies shortness of breath. Gastrointestinal: No abdominal pain.  Vomiting dark emesis.  No diarrhea.  No constipation. Genitourinary: Negative for dysuria. Musculoskeletal: Negative for back pain. Skin: Negative for rash. Neurological: Prior headache, now resolved  10-point ROS otherwise negative, but review of systems is somewhat limited by her dementia.  ____________________________________________   PHYSICAL EXAM:  VITAL SIGNS: ED Triage Vitals  Enc Vitals Group     BP 10/28/15 2343 164/106 mmHg     Pulse Rate 10/28/15 2343 63     Resp 10/28/15 2343 20     Temp 10/28/15 2343 95.3 F (35.2 C)     Temp Source 10/28/15 2343 Rectal     SpO2 10/28/15 2343 98 %     Weight 10/28/15 2343 130 lb (58.968 kg)     Height 10/28/15 2343  (1.702 m)     Head Cir --      Peak Flow --      Pain Score --      Pain Loc --      Pain Edu? --      Excl. in GC? --     Constitutional: Alert but  nauseated and actively vomiting, ill-appearing Eyes: Conjunctivae are normal. PERRL. EOMI. Head: Atraumatic. Nose: No congestion/rhinnorhea. Mouth/Throat: Mucous membranes are moist.  Oropharynx non-erythematous. Neck: No stridor.   Cardiovascular: Irregularly irregular rhythm with a rate that ranges from the 60s to 70s down to the 30s when she is vomiting. Grossly normal heart sounds.  Good peripheral circulation. Respiratory: Normal respiratory effort.  No retractions. Lungs CTAB. Gastrointestinal: Soft and nontender. No distention. No abdominal bruits. No CVA tenderness. Rectal:  Normal external exam.  Light brown stool, Hemoccult negative, quality control passed. Musculoskeletal: No lower extremity tenderness nor edema.  No joint effusions. Neurologic:  Normal speech and language. No gross focal neurologic deficits are appreciated.  Skin:  Skin is warm, dry and intact. No rash noted.   ____________________________________________   LABS (all labs ordered are listed, but only abnormal results are displayed)  Labs Reviewed  CBC WITH DIFFERENTIAL/PLATELET - Abnormal; Notable for the following:    Neutro Abs 7.6 (*)    Lymphs Abs 0.5 (*)    All other components within normal limits  LIPASE, BLOOD - Abnormal; Notable for the following:    Lipase 62 (*)    All other components within normal limits  COMPREHENSIVE METABOLIC PANEL - Abnormal; Notable for the following:    Glucose, Bld 148 (*)    BUN 29 (*)    Creatinine, Ser 1.14 (*)    Calcium 8.8 (*)    GFR calc non Af Amer 42 (*)    GFR calc Af Amer 48 (*)    All other components within normal limits  PROTIME-INR - Abnormal; Notable for the following:    Prothrombin Time 17.7 (*)    All other components within normal limits  TROPONIN I  APTT  LACTIC ACID, PLASMA  LACTIC ACID, PLASMA  URINALYSIS COMPLETEWITH MICROSCOPIC ([facility] ONLY)  TYPE AND SCREEN  ABO/RH   ____________________________________________  EKG  ED ECG  REPORT I, Jase Himmelberger, the attending physician, personally viewed and interpreted this ECG.   Date: 10/29/2015  EKG Time: 23:44  Rate: 58  Rhythm: atrial fibrillation, rate 58 but highly variable   Axis: left axis deviation  Intervals:Left anterior fascicular block  ST&T Change: Non-specific ST segment / T-wave changes, but no evidence of acute ischemia.   ____________________________________________  RADIOLOGY   No results found.  ____________________________________________   PROCEDURES  Procedure(s) performed: None  Critical Care performed: No    ____________________________________________   INITIAL IMPRESSION / ASSESSMENT AND PLAN / ED COURSE  Pertinent labs & imaging results that were available during  my care of the patient were reviewed by me and considered in my medical decision making (see chart for details).  The patient is having gross hematemesis in the setting of an accidental overdose today of Eliquis.  She has no abdominal pain or tenderness at this time.  Given the potential morbidity and mortality of an uncontrolled hematemesis and Eliquis overdose, even a mild one, I will treat her with K-Centra to attempt to reverse the Eliquis be bridged to heparin while in the hospital.  Providing IV fluids.  Here hemoglobin is currently steady but the vomiting just began a couple of hours ago, which also explains hemoccult negative stool.  There is no indication for emergent imaging at this time.  Her bradycardia seems to be due to vagal episodes while vomiting, we will continue to monitor and provide IV fluids.  Two peripheral IVs been established and we have typed and screened the patient.     ____________________________________________  FINAL CLINICAL IMPRESSION(S) / ED DIAGNOSES  Final diagnoses:  Hematemesis with nausea  Overdose of anticoagulant, accidental or unintentional, initial encounter      NEW MEDICATIONS STARTED DURING THIS VISIT:  New  Prescriptions   No medications on file     Loleta Rose, MD 10/29/15 [phone]

## 2015-10-29 NOTE — ED Notes (Signed)
EMS brought back pt's medication list and states that family said at lunchtime today noticed there were 3 Eliquis and 3 Donepezil in medication box and tonight all three pills of both medications were missing.

## 2015-10-29 NOTE — Progress Notes (Signed)
Brief progress note Patient admitted by Dr. Sheryle Haildiamond earlier this morning. Had one episode of streaky dark brown hematemesis. Small volume. No further vomiting. She reports a "queasy sensation" in her stomach. Currently nothing by mouth. I have discussed with gastroenterology, Dr. Mechele CollinElliott, likely conservative management. Continue to monitor blood pressure and hemoglobin. Continue twice a day PPI.

## 2015-10-29 NOTE — Consult Note (Signed)
GI Inpatient Consult Note  Reason for Consult: coffee ground emesis   Attending Requesting Consult: Dr. Sheryle Hailiamond  History of Present Illness: Shelly Zamora is a 81 y.o. female who has a significant history of dementia, A fib, aortic valve stenosis, anticoagulated on Eliquis, presented to the ED last night after her sons discovered that she may have taken 3 extra tablets of Eliquis and 3 extra tabs of Aricept.  She was also vomiting coffee ground emesis.  Her son, Shelly Zamora, is present in the room with her and has been with her since 9 this morning.  He is unsure of the amount of times she may have vomited last night, but reports no emesis since 9am.  She was heme negative via rectal exam in the ED.  She denies any pain, chest pain or SOB.  He reports she does not complain on heartburn or acid reflux, does not have a history of stomach ulcers and does not take a daily PPI.  He is unaware is she has ever had an upper endoscopy.  Her last colonoscopy was in 2007 and was normal colon.   Past Medical History:  Past Medical History  Diagnosis Date  . Dementia   . Atrial fibrillation [facility](HCC)     Problem List: Patient Active Problem List   Diagnosis Date Noted  . Hematemesis 10/29/2015    Past Surgical History: Past Surgical History  Procedure Laterality Date  . Thyroidectomy    . Abdominal hysterectomy    . Knee surgery      Allergies: No Known Allergies  Home Medications: Prescriptions prior to admission  Medication Sig Dispense Refill Last Dose  . apixaban (ELIQUIS) 2.5 MG TABS tablet Take 1 tablet by mouth 2 (two) times daily.   10/28/2015 at Unknown time  . atenolol (TENORMIN) 25 MG tablet Take 0.5 tablets by mouth daily.   10/28/2015 at Unknown time  . donepezil (ARICEPT) 10 MG tablet Take 1 tablet by mouth at bedtime.   Past Week at Unknown time  . levothyroxine (SYNTHROID, LEVOTHROID) 50 MCG tablet Take 1 tablet by mouth daily.   10/28/2015 at Unknown time  . Travoprost, BAK  Free, (TRAVATAN Z) 0.004 % SOLN ophthalmic solution Apply 1 drop to eye at bedtime.   Past Week at Unknown time   Home medication reconciliation was completed with the patient.   Scheduled Inpatient Medications:   . atenolol  12.5 mg Oral Daily  . donepezil  10 mg Oral QHS  . latanoprost  1 drop Both Eyes QHS  . levothyroxine  50 mcg Oral QAC breakfast  . pantoprazole (PROTONIX) IV  40 mg Intravenous Q12H  . sodium chloride  3 mL Intravenous Q12H    Continuous Inpatient Infusions:   . sodium chloride 125 mL/hr at 10/29/15 1400    PRN Inpatient Medications:  acetaminophen **OR** acetaminophen, fentaNYL (SUBLIMAZE) injection, ondansetron **OR** ondansetron (ZOFRAN) IV  Family History: family history is not on file.    Social History:   reports that she has never smoked. She does not have any smokeless tobacco history on file.    Review of Systems: Constitutional: Weight is stable.  Eyes: No changes in vision. ENT: No oral lesions, sore throat.  GI: see HPI.  Heme/Lymph: No easy bruising.  CV: No chest pain.  GU: No hematuria.  Integumentary: No rashes.  Neuro: No headaches.  Psych: Significant dementia, memory loss noted. Endocrine: No heat/cold intolerance.  Allergic/Immunologic: No urticaria.  Resp: No cough, SOB.  Musculoskeletal: No joint swelling.  Physical Examination: BP 136/63 mmHg  Pulse 67  Temp(Src) 97.9 F (36.6 C) (Oral)  Resp 18  Ht  (1.702 m)  Wt 59.421 kg (131 lb)  BMI 20.51 kg/m2  SpO2 98% Gen: NAD, alert, son Shelly Hidden is present and helps with history.  She does not remember vomiting and is anxious to go home. HEENT: PEERLA, EOMI, Neck: supple, no JVD or thyromegaly Chest: CTA bilaterally, no wheezes, crackles, or other adventitious sounds CV: irr irr, murmur noted Abd: soft, NT, ND, +BS in all four quadrants; no HSM, guarding, ridigity, or rebound tenderness Ext: no edema, well perfused with 2+ pulses, Skin: no rash or lesions  noted Lymph: no LAD  Data: Lab Results  Component Value Date   WBC 8.4 10/29/2015   HGB 12.1 10/29/2015   HCT 36.5 10/29/2015   MCV 96.8 10/29/2015   PLT 195 10/29/2015    Recent Labs Lab 10/29/15 0105 10/29/15 0457  HGB 12.2 12.1   Lab Results  Component Value Date   NA 139 10/29/2015   K 4.2 10/29/2015   CL 105 10/29/2015   CO2 27 10/29/2015   BUN 29* 10/29/2015   CREATININE 1.14* 10/29/2015   Lab Results  Component Value Date   ALT 20 10/29/2015   AST 26 10/29/2015   ALKPHOS 73 10/29/2015   BILITOT 1.0 10/29/2015    Recent Labs Lab 10/29/15 0105  APTT 30  INR 1.45     Assessment/Plan: Ms. Shelly Zamora is a 81 y.o. female with possible upper GI bleed secondary to accidental increased doses of Eliquis.  Reversal agent has been given.  Unsure of last emesis, but none since 9am.  Confirmed hematemesis in the ED last night, heme negative stool in the ED. Hgb 1 month ago 13.6, 7 months ago 13.7, 12.2 on admission.  BUN/creatinine 29/1.14.  Lipase 62.  PT 17.7 INR 1.45  Recommendations: She appears to have stabilized.  We will continue to watch and if she has any further coffee ground emesis we will consider further evaluation with endoscopy.  She should have just water today and advance to clear liquids tomorrow. We agree with serial hgb checks, Protonix.  We will continue to follow with you.  Thank you for the consult. Please call with questions or concerns.  Carney Harder, PA-C  I personally performed these services.

## 2015-10-29 NOTE — ED Notes (Signed)
Pt's temperature reassessed and noted to be 96.2 axiallary. Two more warm blankets placed on pt.

## 2015-10-30 LAB — CBC
HCT: 35.4 % (ref 35.0–47.0)
HEMATOCRIT: 33.4 % — AB (ref 35.0–47.0)
HEMOGLOBIN: 11.7 g/dL — AB (ref 12.0–16.0)
Hemoglobin: 11 g/dL — ABNORMAL LOW (ref 12.0–16.0)
MCH: 32.1 pg (ref 26.0–34.0)
MCH: 32.1 pg (ref 26.0–34.0)
MCHC: 32.9 g/dL (ref 32.0–36.0)
MCHC: 33.1 g/dL (ref 32.0–36.0)
MCV: 97.4 fL (ref 80.0–100.0)
MCV: 96.8 fL (ref 80.0–100.0)
Platelets: 170 10*3/uL (ref 150–440)
Platelets: 174 10*3/uL (ref 150–440)
RBC: 3.43 MIL/uL — AB (ref 3.80–5.20)
RBC: 3.66 MIL/uL — AB (ref 3.80–5.20)
RDW: 12.8 % (ref 11.5–14.5)
RDW: 12.8 % (ref 11.5–14.5)
WBC: 4.3 10*3/uL (ref 3.6–11.0)
WBC: 4.3 10*3/uL (ref 3.6–11.0)

## 2015-10-30 LAB — BASIC METABOLIC PANEL
ANION GAP: 6 (ref 5–15)
BUN: 19 mg/dL (ref 6–20)
CHLORIDE: 113 mmol/L — AB (ref 101–111)
CO2: 21 mmol/L — ABNORMAL LOW (ref 22–32)
Calcium: 8.3 mg/dL — ABNORMAL LOW (ref 8.9–10.3)
Creatinine, Ser: 1.05 mg/dL — ABNORMAL HIGH (ref 0.44–1.00)
GFR calc non Af Amer: 46 mL/min — ABNORMAL LOW (ref >60)
GFR, EST AFRICAN AMERICAN: 53 mL/min — AB (ref >60)
Glucose, Bld: 95 mg/dL (ref 65–99)
POTASSIUM: 3.9 mmol/L (ref 3.5–5.1)
SODIUM: 140 mmol/L (ref 135–145)

## 2015-10-30 LAB — PROTIME-INR
INR: 1.29
PROTHROMBIN TIME: 16.2 s — AB (ref 11.4–15.0)

## 2015-10-30 MED ORDER — PANTOPRAZOLE SODIUM 40 MG PO TBEC: 40.0000 mg | DELAYED_RELEASE_TABLET | Freq: Every day | ORAL | Status: AC

## 2015-10-30 MED ORDER — PANTOPRAZOLE SODIUM 40 MG PO TBEC: 40.0000 mg | DELAYED_RELEASE_TABLET | Freq: Two times a day (BID) | ORAL | Status: DC

## 2015-10-30 NOTE — Progress Notes (Signed)
Patient slept well throughout the night. No bloody emesis or stools this shift. Patient denies any pain. Patient being monitored via heart monitor.

## 2015-10-30 NOTE — Progress Notes (Signed)
[facility] monitoring:  81 yo F on Apixaban at home with apparent accidental overdose (3 extra apixaban 2.5mg  tablets), hematemesis.  [facility] ordered and 1500 units given 12/18 at 0310.  12/19 INR: 1.29, Hgb: 11.0, Platelets: 170  No further bleeding noted. Per MD note awaiting GI input for restarting Apixaban. Will continue to follow INR and CBC.  Clovia CuffLisa Lolita Faulds, PharmD, BCPS 10/30/2015 10:32 AM

## 2015-10-30 NOTE — Progress Notes (Signed)
Patient is to be discharged today. Patient is in no acute distress at this time, and assessment is unchanged from this morning. Patient's IV is out, discharge paperwork has been discussed with patient/family and there are no questions or concerns at this time. Patient will be accompanied downstairs by staff and family via wheelchair.   

## 2015-10-30 NOTE — Care Management Important Message (Signed)
Important Message  Patient Details  Name: Shelly Zamora MRN: [id number] Date of Birth: [birth year]   Medicare Important Message Given:  Yes    Valor Quaintance A, RN 10/30/2015, 7:52 AM

## 2015-10-30 NOTE — Discharge Instructions (Signed)
°  DIET:  Regular diet, soft foods  DISCHARGE CONDITION:  Fair  ACTIVITY:  Activity as tolerated  OXYGEN:  Home Oxygen: No.   Oxygen Delivery: room air  DISCHARGE LOCATION:  home   If you experience worsening of your admission symptoms, develop shortness of breath, life threatening emergency, suicidal or homicidal thoughts you must seek medical attention immediately by calling 911 or calling your MD immediately  if symptoms less severe.  You Must read complete instructions/literature along with all the possible adverse reactions/side effects for all the Medicines you take and that have been prescribed to you. Take any new Medicines after you have completely understood and accpet all the possible adverse reactions/side effects.   Please note  You were cared for by a hospitalist during your hospital stay. If you have any questions about your discharge medications or the care you received while you were in the hospital after you are discharged, you can call the unit and asked to speak with the hospitalist on call if the hospitalist that took care of you is not available. Once you are discharged, your primary care physician will handle any further medical issues. Please note that NO REFILLS for any discharge medications will be authorized once you are discharged, as it is imperative that you return to your primary care physician (or establish a relationship with a primary care physician if you do not have one) for your aftercare needs so that they can reassess your need for medications and monitor your lab values.

## 2015-10-30 NOTE — Care Management Note (Signed)
Case Management Note  Patient Details  Name: Shelly Zamora MRN: [id number] Date of Birth: 04-May-1925  Subjective/Objective:  Discussed discharge planning with [name] and her son after speaking with Dr Clent RidgesWalsh on the phone. Dr Clent RidgesWalsh is awaiting recommendations from GI before writing final discharge orders. [name]'s son states that he is coordinating with other family members to personally give [name] her medication on a daily basis to avoid any potential for overdose in the future. He also reports that he plans to take his Mother home with him immediately after this hospital discharge.                    Action/Plan:   Expected Discharge Date:                  Expected Discharge Plan:     In-House Referral:     Discharge planning Services     Post Acute Care Choice:    Choice offered to:     DME Arranged:    DME Agency:     HH Arranged:    HH Agency:     Status of Service:     Medicare Important Message Given:  Yes Date Medicare IM Given:    Medicare IM give by:    Date Additional Medicare IM Given:    Additional Medicare Important Message give by:     If discussed at Long Length of Stay Meetings, dates discussed:    Additional Comments:  Shabnam Ladd A, RN 10/30/2015, 2:27 PM

## 2015-10-30 NOTE — Progress Notes (Signed)
[facility] -  at [facility]   PATIENT NAME: Shelly BeneBrenda Zamora    MR#:  [id number]  DATE OF BIRTH:  11-12-1924  SUBJECTIVE:  CHIEF COMPLAINT:   Chief Complaint  Patient presents with  . Weakness  . Emesis   Comfortable. No nausea or pain. No further vomiting  REVIEW OF SYSTEMS:   Review of Systems  Constitutional: Negative for fever.  Respiratory: Negative for shortness of breath.   Cardiovascular: Negative for chest pain and palpitations.  Gastrointestinal: Negative for nausea, vomiting and abdominal pain.  Genitourinary: Negative for dysuria.    DRUG ALLERGIES:  No Known Allergies  VITALS:  Blood pressure 122/60, pulse 64, temperature 98.7 F (37.1 C), temperature source Oral, resp. rate 18, height 5\' 7"  (1.702 m), weight 56.518 kg (124 lb 9.6 oz), SpO2 100 %.  PHYSICAL EXAMINATION:  GENERAL:  81 y.o.-year-old patient lying in the bed with no acute distress.  NECK:  Supple, no jugular venous distention. No thyroid enlargement, no tenderness.  LUNGS: Normal breath sounds bilaterally, no wheezing, rales,rhonchi or crepitation. No use of accessory muscles of respiration.  CARDIOVASCULAR: S1, S2 normal. No murmurs, rubs, or gallops.  ABDOMEN: Soft, nontender, nondistended. Bowel sounds present. No organomegaly or mass.  EXTREMITIES: No pedal edema, cyanosis, or clubbing.  NEUROLOGIC: Cranial nerves II through XII are intact. Muscle strength 5/5 in all extremities. Sensation intact. Gait not checked.  PSYCHIATRIC: The patient is alert and oriented x 3.  SKIN: No obvious rash, lesion, or ulcer.    LABORATORY PANEL:   CBC  Recent Labs Lab 10/30/15 0318  WBC 4.3  HGB 11.0*  HCT 33.4*  PLT 170   ------------------------------------------------------------------------------------------------------------------  Chemistries   Recent Labs Lab 10/29/15 0105 10/30/15 0318  NA 139 140  K 4.2 3.9  CL 105 113*  CO2 27 21*  GLUCOSE  148* 95  BUN 29* 19  CREATININE 1.14* 1.05*  CALCIUM 8.8* 8.3*  AST 26  --   ALT 20  --   ALKPHOS 73  --   BILITOT 1.0  --    ------------------------------------------------------------------------------------------------------------------  Cardiac Enzymes  Recent Labs Lab 10/29/15 0105  TROPONINI <0.03   ------------------------------------------------------------------------------------------------------------------  RADIOLOGY:  No results found.  EKG:   Orders placed or performed during the hospital encounter of 10/28/15  . EKG 12-Lead  . EKG 12-Lead    ASSESSMENT AND PLAN:   This is an 81 year old Caucasian female admitted for hematemesis . 1. Hematemesis:  - BP and hgb fairly stable. No further episodes - appriciate GI consultation and agree with conservative management - will advance to full liquids this morning and recheck CBC later. If stable anticipate dc this afternoon. - would like GI recs on when to resume eliquis - transition to PO protonix  2. Atrial fibrillation:  - rate is controlled, continue atenolol - will restart eliquis when GI recomends  3. Essential hypertension: controlled - Continue atenolol   4. Hypothyroidism: Continue Synthroid  5. Dementia: Continue Aricept 5  6. DVT prophylaxis: SCDs  7. GI prophylaxis as above  All the records are reviewed and case discussed with Care Management/Social Workerr. Management plans discussed with the patient, family and they are in agreement.  CODE STATUS: full  TOTAL TIME TAKING CARE OF THIS PATIENT: 25 minutes.  Greater than 50% of time spent in care coordination and counseling. POSSIBLE D/C later today   Elby ShowersWALSH, CATHERINE M.D on 10/30/2015 at 9:42 AM  Between 7am to 6pm - Pager - [phone]  After 6pm  go to [email or web address] - password [email or web address] [facility]  [facility] [place] [facility]  Office  [phone]  CC: Primary care physician; Marisue Ivan, MD

## 2015-11-02 NOTE — Discharge Summary (Signed)
[facility] - [place] at [facility]  DISCHARGE SUMMARY   PATIENT NAME: Shelly Zamora    MR#:  [id number]  DATE OF BIRTH:  08-Jul-1925  DATE OF ADMISSION:  10/28/2015 ADMITTING PHYSICIAN: Arnaldo Natal, MD  DATE OF DISCHARGE: 10/30/2015  PRIMARY CARE PHYSICIAN: Marisue Ivan, MD    ADMISSION DIAGNOSIS:  Overdose of anticoagulant, accidental or unintentional, initial encounter [T45.511A] Hematemesis with nausea [K92.0, R11.0]  DISCHARGE DIAGNOSIS:  Active Problems:   Hematemesis   SECONDARY DIAGNOSIS:   Past Medical History  Diagnosis Date  . Dementia   . Atrial fibrillation [facility])     HOSPITAL COURSE:    This is an 81 year old Caucasian female admitted for hematemesis:   1. Hematemesis:  BP and hgb fairly stable. No further episodes after admission. Treated with PPI. She was tolerating regular diet at the time of discharge.  2. Atrial fibrillation: Rate controlled on atenolol. She had been on eliquis prior to admission. There was concern that she may have taken excessive doses of eliquis by accident. She will hold it until seeing her primary care provider. Once repeat CBC is demonstrated to be stable she can restart.  3. Essential hypertension: controlled - Continue atenolol   4. Hypothyroidism: Continue Synthroid  5. Dementia: Continue Aricept 5  DISCHARGE CONDITIONS:   Stable  CONSULTS OBTAINED:    Gastroenterology, Dr. Mechele Collin  DRUG ALLERGIES:  No Known Allergies  DISCHARGE MEDICATIONS:   Discharge Medication List as of 10/30/2015  4:57 PM    START taking these medications   Details  pantoprazole (PROTONIX) 40 MG tablet Take 1 tablet (40 mg total) by mouth daily., Starting 10/30/2015, Until Discontinued, Normal      CONTINUE these medications which have NOT CHANGED   Details  apixaban (ELIQUIS) 2.5 MG TABS tablet Take 1 tablet by mouth 2 (two) times daily., Starting 05/08/2015, Until Discontinued,  Historical Med    atenolol (TENORMIN) 25 MG tablet Take 0.5 tablets by mouth daily., Starting 05/08/2015, Until Discontinued, Historical Med    donepezil (ARICEPT) 10 MG tablet Take 1 tablet by mouth at bedtime., Starting 09/21/2015, Until Discontinued, Historical Med    levothyroxine (SYNTHROID, LEVOTHROID) 50 MCG tablet Take 1 tablet by mouth daily., Starting 07/24/2015, Until Tue 07/23/16, Historical Med    Travoprost, BAK Free, (TRAVATAN Z) 0.004 % SOLN ophthalmic solution Apply 1 drop to eye at bedtime., Starting 12/06/2014, Until Discontinued, Historical Med         DISCHARGE INSTRUCTIONS:   Discharged home. Follow-up with Dr. Mechele Collin. Regular diet. No home health needs.  If you experience worsening of your admission symptoms, develop shortness of breath, life threatening emergency, suicidal or homicidal thoughts you must seek medical attention immediately by calling 911 or calling your MD immediately  if symptoms less severe.  You Must read complete instructions/literature along with all the possible adverse reactions/side effects for all the Medicines you take and that have been prescribed to you. Take any new Medicines after you have completely understood and accept all the possible adverse reactions/side effects.   Please note  You were cared for by a hospitalist during your hospital stay. If you have any questions about your discharge medications or the care you received while you were in the hospital after you are discharged, you can call the unit and asked to speak with the hospitalist on call if the hospitalist that took care of you is not available. Once you are discharged, your primary care physician will handle any further medical issues.  Please note that NO REFILLS for any discharge medications will be authorized once you are discharged, as it is imperative that you return to your primary care physician (or establish a relationship with a primary care physician if you do not  have one) for your aftercare needs so that they can reassess your need for medications and monitor your lab values.    Today   CHIEF COMPLAINT:   Chief Complaint  Patient presents with  . Weakness  . Emesis    HISTORY OF PRESENT ILLNESS:  The patient presents emergency department after vomiting and appearing ill and generally weak visited by family members. In the emergency department she had an episode of gross hematemesis of coffee ground appearance. She was also found to be somewhat hypothermic with a rectal temperature of 95.51F. Notably the patient has no recollection of feeling bad or of vomiting blood. She also denies diarrhea or blood in her stool. Past history significant for dementia and the patient had multiple Aricept pills missing from her medicine box, as well as 3 tablets of Eliquis. In the emergency department she was given [facility] to reverse anticoagulant overdose. Due to apparent GI bleed the emergency department staff called for admission.  VITAL SIGNS:  Blood pressure 108/79, pulse 60, temperature 98.9 F (37.2 C), temperature source Oral, resp. rate 18, height  (1.702 m), weight 56.518 kg (124 lb 9.6 oz), SpO2 99 %.  I/O:  No intake or output data in the 24 hours ending 11/02/15 1542  PHYSICAL EXAMINATION:  GENERAL: 81 y.o.-year-old patient lying in the bed with no acute distress.  NECK: Supple, no jugular venous distention. No thyroid enlargement, no tenderness.  LUNGS: Normal breath sounds bilaterally, no wheezing, rales,rhonchi or crepitation. No use of accessory muscles of respiration.  CARDIOVASCULAR: S1, S2 normal. No murmurs, rubs, or gallops.  ABDOMEN: Soft, nontender, nondistended. Bowel sounds present. No organomegaly or mass.  EXTREMITIES: No pedal edema, cyanosis, or clubbing.  NEUROLOGIC: Cranial nerves II through XII are intact. Muscle strength 5/5 in all extremities. Sensation intact. Gait not checked.  PSYCHIATRIC: The patient is  alert and oriented x 3.  SKIN: No obvious rash, lesion, or ulcer.   DATA REVIEW:   CBC  Recent Labs Lab 10/30/15 1054  WBC 4.3  HGB 11.7*  HCT 35.4  PLT 174    Chemistries   Recent Labs Lab 10/29/15 0105 10/30/15 0318  NA 139 140  K 4.2 3.9  CL 105 113*  CO2 27 21*  GLUCOSE 148* 95  BUN 29* 19  CREATININE 1.14* 1.05*  CALCIUM 8.8* 8.3*  AST 26  --   ALT 20  --   ALKPHOS 73  --   BILITOT 1.0  --     Cardiac Enzymes  Recent Labs Lab 10/29/15 0105  TROPONINI <0.03    Microbiology Results  Results for orders placed or performed in visit on 04/27/14  Urine culture     Status: None   Collection Time: 04/27/14  9:02 AM  Result Value Ref Range Status   Micro Text Report   Final       SOURCE: CLEAN CATCH    COMMENT                   NO GROWTH IN 18-24 HOURS   ANTIBIOTIC  RADIOLOGY:  No results found.  EKG:   Orders placed or performed during the hospital encounter of 10/28/15  . EKG 12-Lead  . EKG 12-Lead      Management plans discussed with the patient, family and they are in agreement.  CODE STATUS:  Advance Directive Documentation        Most Recent Value   Type of Advance Directive  Healthcare Power of Attorney   Pre-existing out of facility DNR order (yellow form or pink MOST form)     "MOST" Form in Place?        TOTAL TIME TAKING CARE OF THIS PATIENT: 35 minutes.  Greater than 50% of time spent in care coordination and counseling.  Elby ShowersWALSH, Gor Vestal M.D on 11/02/2015 at 3:42 PM  Between 7am to 6pm - Pager - [phone]  After 6pm go to [email or web address] - password [email or web address] [facility]  [facility]  Office  [phone]  CC: Primary care physician; Marisue IvanLINTHAVONG, KANHKA, MD

## 2019-04-12 DEATH — deceased
